# Patient Record
Sex: Female | Born: 1960 | Race: White | Hispanic: No | Marital: Single | Smoking: Former smoker
Health system: Southern US, Community
[De-identification: ages and names within clinical notes are randomized; demographics above are authoritative.]

---

## 1999-10-03 ENCOUNTER — Other Ambulatory Visit: Admission: RE | Admit: 1999-10-03 | Discharge: 1999-10-03 | Payer: Self-pay | Admitting: Obstetrics and Gynecology

## 2001-04-10 ENCOUNTER — Other Ambulatory Visit: Admission: RE | Admit: 2001-04-10 | Discharge: 2001-04-10 | Payer: Self-pay | Admitting: Obstetrics and Gynecology

## 2001-05-16 ENCOUNTER — Encounter: Payer: Self-pay | Admitting: Obstetrics and Gynecology

## 2001-05-16 ENCOUNTER — Ambulatory Visit (HOSPITAL_COMMUNITY): Admission: RE | Admit: 2001-05-16 | Discharge: 2001-05-16 | Payer: Self-pay | Admitting: Obstetrics and Gynecology

## 2001-08-22 ENCOUNTER — Encounter: Payer: Self-pay | Admitting: Obstetrics and Gynecology

## 2001-08-22 ENCOUNTER — Ambulatory Visit (HOSPITAL_COMMUNITY): Admission: RE | Admit: 2001-08-22 | Discharge: 2001-08-22 | Payer: Self-pay | Admitting: Obstetrics and Gynecology

## 2001-10-08 ENCOUNTER — Inpatient Hospital Stay (HOSPITAL_COMMUNITY): Admission: AD | Admit: 2001-10-08 | Discharge: 2001-10-10 | Payer: Self-pay | Admitting: Obstetrics and Gynecology

## 2001-10-17 ENCOUNTER — Encounter: Admission: RE | Admit: 2001-10-17 | Discharge: 2001-11-16 | Payer: Self-pay | Admitting: Obstetrics and Gynecology

## 2001-11-17 ENCOUNTER — Encounter: Admission: RE | Admit: 2001-11-17 | Discharge: 2001-12-17 | Payer: Self-pay | Admitting: Obstetrics and Gynecology

## 2003-11-11 ENCOUNTER — Other Ambulatory Visit: Admission: RE | Admit: 2003-11-11 | Discharge: 2003-11-11 | Payer: Self-pay | Admitting: Obstetrics and Gynecology

## 2006-08-05 ENCOUNTER — Other Ambulatory Visit: Admission: RE | Admit: 2006-08-05 | Discharge: 2006-08-05 | Payer: Self-pay | Admitting: Obstetrics and Gynecology

## 2008-05-28 ENCOUNTER — Other Ambulatory Visit: Admission: RE | Admit: 2008-05-28 | Discharge: 2008-05-28 | Payer: Self-pay | Admitting: Family Medicine

## 2009-07-01 ENCOUNTER — Other Ambulatory Visit: Admission: RE | Admit: 2009-07-01 | Discharge: 2009-07-01 | Payer: Self-pay | Admitting: Family Medicine

## 2010-06-01 ENCOUNTER — Other Ambulatory Visit
Admission: RE | Admit: 2010-06-01 | Discharge: 2010-06-01 | Payer: Self-pay | Source: Home / Self Care | Admitting: Family Medicine

## 2011-05-18 NOTE — Discharge Summary (Signed)
[facility]  Patient:    Shelley Collins, Shelley Collins Visit Number: [id number] MRN: [id number]          Service Type: OBS Location: [id number] Attending Physician:  Oliver Pila Dictated by:   Alvino Chapel, M.D. Admit Date:  10/08/2001 Discharge Date: 10/10/2001                             Discharge Summary  DISCHARGE DIAGNOSES:          1. Term pregnancy at 39 weeks, delivered.                               2. Advanced maternal age.                               3. Status post normal spontaneous vaginal                                  delivery.  DISCHARGE MEDICATIONS:        1. Motrin 600 mg p.o. q.6h.                               2. Percocet 1-2 tablets p.o. q.4h. p.r.n.  FOLLOW-UP:                    The patient is to follow up in six weeks for her routine postpartum exam.  HISTORY OF PRESENT ILLNESS:   Ms. Shelley Collins is a 50 year old, G4, P3, 0-0-3, who was admitted at 39 weeks for induction given a favorable cervix and advanced maternal age.  Pregnancy had been normal except for her advanced maternal age and decline amniocentesis.  Ultrasound was normal except for renal polyp. This resolved by 34 weeks.  There was a history of neural tube defect in the family but the fetus spine was normal on ultrasound.  PRENATAL LABORATORY:          B positive.  Antibody negative.  RPR nonreactive.  Rubella immuned.  Hepatitis B surface antigen negative.  HIV negative.  GC negative.  Chlamydia negative.  GBS negative.  ASC borderline.  PAST OBSTETRICAL HISTORY:     In 1990, she had normal spontaneous vaginal delivery of a 7 pound 15 ounce infant.  In 1992, she had a vaginal delivery of a 7 pound 15 ounce infant.  In 1995, she had a vaginal delivery of a 7 pound 9 ounce infant.  PAST GYNECOLOGY HISTORY:      She had an abnormal Pap smear with a repeat within normal limits and a colposcopy normal.  PAST SURGICAL HISTORY:        None.  PAST  MEDICAL HISTORY:         None.  ALLERGIES:                    None.  PHYSICAL EXAMINATION:  VITAL SIGNS:                  She was afebrile with stable vital signs.  Fetal heart rate was reactive.  Estimated fetal weight was 7 pounds.  CERVIX:  Cervix was 50% effaced, 1+ cm, and a -2 station.  HOSPITAL COURSE:              She had assisted rupture of membrane with clear fluid obtained and was begun on low dose Pitocin.  The patient quickly reached complete dilatation and pushed well with a normal spontaneous vaginal delivery of a vigorous female infant over a small second degree perineal laceration. Apgars were 9 and 9.  Weight was 7 pounds 5 ounces.  Placenta delivered spontaneously with a three vessel cord and her laceration was repaired with 2-0 Vicryl.  Cervix and rectum were intact.  EBL was approximately 300 cc.  She was then admitted for routine postpartum care and did very well.  On postpartum day two, she remained afebrile with stable vital signs.  She was breast-feeding and therefore was felt stable for discharge and will follow up as previously stated. Dictated by:   Alvino Chapel, M.D. Attending Physician:  Oliver Pila DD:  10/10/01 TD:  10/10/01 Job: [phone] [id number]

## 2011-07-12 ENCOUNTER — Other Ambulatory Visit (HOSPITAL_COMMUNITY)
Admission: RE | Admit: 2011-07-12 | Discharge: 2011-07-12 | Disposition: A | Discharge status: Home / Self Care | Payer: PRIVATE HEALTH INSURANCE | Source: Ambulatory Visit | Attending: Family Medicine | Admitting: Family Medicine

## 2011-07-12 DIAGNOSIS — Z1159 Encounter for screening for other viral diseases: Secondary | ICD-10-CM | POA: Insufficient documentation

## 2011-07-12 DIAGNOSIS — Z124 Encounter for screening for malignant neoplasm of cervix: Secondary | ICD-10-CM | POA: Insufficient documentation

## 2014-06-18 ENCOUNTER — Other Ambulatory Visit: Payer: Self-pay | Admitting: Family Medicine

## 2014-06-18 DIAGNOSIS — E010 Iodine-deficiency related diffuse (endemic) goiter: Secondary | ICD-10-CM

## 2014-06-23 ENCOUNTER — Ambulatory Visit
Admission: RE | Admit: 2014-06-23 | Discharge: 2014-06-23 | Disposition: A | Discharge status: Home / Self Care | Payer: PRIVATE HEALTH INSURANCE | Source: Ambulatory Visit | Attending: Family Medicine | Admitting: Family Medicine

## 2014-06-23 DIAGNOSIS — E010 Iodine-deficiency related diffuse (endemic) goiter: Secondary | ICD-10-CM

## 2014-07-15 ENCOUNTER — Encounter: Payer: Self-pay | Admitting: Internal Medicine

## 2014-07-15 ENCOUNTER — Ambulatory Visit (INDEPENDENT_AMBULATORY_CARE_PROVIDER_SITE_OTHER): Payer: PRIVATE HEALTH INSURANCE | Admitting: Internal Medicine

## 2014-07-15 VITALS — BP 120/74 | HR 62 | Temp 98.2°F | Resp 12 | Ht 67.75 in | Wt 152.6 lb

## 2014-07-15 DIAGNOSIS — E042 Nontoxic multinodular goiter: Secondary | ICD-10-CM

## 2014-07-15 LAB — VITAMIN D 25 HYDROXY (VIT D DEFICIENCY, FRACTURES): VITD: 42.63 ng/mL

## 2014-07-15 NOTE — Progress Notes (Addendum)
Patient ID: Shelley Collins, female   DOB: 06-Jan-1961, 53 y.o.   MRN: [id number]   HPI  Shelley Collins is a 53 y.o.-year-old femaler-old female, referred by her PCP, Dr. Percell Miller, in consultation for 1 thyroid nodule and 1 possible parathyroid nodule.  Thyroid U/S (06/23/2014): 1.5 x 0.7 x 0.6 cm mass posterior to the lower L lobe (? Parathyroid tissue) and 1.4 x 0.7 x 0.9 cm nodule in lower R lobe (with microcalcifications).  I reviewed pt's thyroid tests: 06/17/2014: TSH 1.04   Pt denies feeling nodules in neck, hoarseness, + dysphagia with pills within the last 6 mo/no odynophagia, SOB with lying down. She felt pressure in her neck when she lay down.  Pt c/o: - heat intolerance/cold intolerance - tremors - palpitations - anxiety/depression - hyperdefecation/constipation - weight loss - weight gain - dry skin - hair falling - problems with concentration - fatigue  Pt does have a FH of thyroid ds: GM. No FH of thyroid cancer. No h/o radiation tx to head or neck. No h/o hypercalcemia. Last calcium levels reportedly normal last year.  No seaweed or kelp, no recent contrast studies. No steroid use. No herbal supplements.   ROS: Constitutional: + weight loss (more active during the summer - playing tennis), no fatigue, + subjective hypothermia, + poor sleep Eyes: no blurry vision, no xerophthalmia ENT: no sore throat, see HPICardiovascular: no CP/+ SOB/no palpitations/leg swelling Respiratory: no cough/+ SOB Gastrointestinal: no N/V/D/+ C Musculoskeletal: no muscle/joint aches Skin: + rash, + hair loss Neurological: no tremors/numbness/tingling/dizziness Psychiatric: no depression/anxiety  No PMH.  No PSxH.  History   Social History  . Marital Status: married    Spouse Name: N/A    Number of Children: 4   Occupational History  . RN   Social History Main Topics  . Smoking status: Former Smoker, quit 1990  . Smokeless tobacco: No  . Alcohol Use: 2-3x a week, wine 1-2 drinks  . Drug  Use: No   Current Outpatient Rx  Name  Route  Sig  Dispense  Refill  . amphetamine-dextroamphetamine (ADDERALL) 30 MG tablet   Oral   Take 30 mg by mouth daily.         . Fish Oil-Cholecalciferol (FISH OIL + D3 PO)      1 capsule         . Multiple Vitamin (MULTIVITAMIN) tablet   Oral   Take 1 tablet by mouth daily.          NKDA  FH:  father - DM2 mother - HTN, HL  PE: BP 120/74  Pulse 62  Temp(Src) 98.2 F (36.8 C) (Oral)  Resp 12  Ht 5' 7.75" (1.721 m)  Wt 152 lb 9.6 oz (69.219 kg)  BMI 23.37 kg/m2  SpO2 98% Wt Readings from Last 3 Encounters:  07/15/14 152 lb 9.6 oz (69.219 kg)   Constitutional: overweight, in NAD Eyes: PERRLA, EOMI, no exophthalmos ENT: moist mucous membranes, no thyromegaly , no nodules palpated..., no cervical lymphadenopathy Cardiovascular: RRR, No MRG Respiratory: CTA B Gastrointestinal: abdomen soft, NT, ND, BS+ Musculoskeletal: no deformities, strength intact in all 4;  Skin: moist, warm, no rashes Neurological: no tremor with outstretched hands, DTR normal in all 4  ASSESSMENT: 1. Thyroid nodules (possibly 1 parathyroid nodule)  PLAN: 1.  - I reviewed the images of her thyroid ultrasound along with the patient and her husband I pointed out that the dominant nodules are not large, but the right nodule has microcalcifications and internal blood flow. Pt  does not have a thyroid cancer family history or a personal history of RxTx to head/neck, favoring benignity. - the only way that we can tell exactly if it is cancer or not is by doing a thyroid biopsy (FNA) of each. I explained what the test entails. - We discussed about other options, to wait for another 6 months to a year and see if the nodule grows, and only intervene at that time.  - I explained that this is not cancer, we can continue to follow her on a yearly basis, and check another ultrasound in another year or 2. - she should let me know if she develops neck compression  symptoms, in that case, we might need to do either lobectomy or thyroidectomy - I did explain that, while thyroid surgery is not a complicated one, it still can have side effects and also she might have a risk of ~25% of becoming hypothyroid after hemithyroidectomy. She wousl like to avoid surgery, if possible. - patient decided to have the FNA done now >> I will order this, but will first check a PTH + Ca just in case the L nodule might be parathyroid cancer (highly doubt this) in which a Bx is contraindicated. - I'll see her back in a year, assuming her FNA is normal. If FNA abnormal, we will meet sooner.  - I advised pt to join my chart and I will send her the results through there   Office Visit on 07/15/2014  Component Date Value Ref Range Status  . Calcium 07/15/2014 9.7  8.7 - 10.2 mg/dL Final  . PTH 07/15/2014 18  15 - 65 pg/mL Final  . PTH 07/15/2014 Comment   Final   Comment: Interpretation                 Intact PTH    Calcium                                                          (pg/mL)      (mg/dL)                          Normal                          15 - 65     8.6 - 10.2                          Primary Hyperparathyroidism         >65          >10.2                          Secondary Hyperparathyroidism       >65          <10.2                          Non-Parathyroid Hypercalcemia       <65          >10.2  Hypoparathyroidism                  <15          < 8.6                          Non-Parathyroid Hypocalcemia    15 - 65          < 8.6  . VITD 07/15/2014 42.63   Final   Deficient = <20Insufficient = 20 to <30Sufficient = 30 -100Upper Safety Limit = >100   PTH, calcium, and vitamin D are normal >> the L nodule does not appear to over-secrete parathyroid hormone >> I will order the 2 nodule Bx's as discussed.  Adequacy Reason Satisfactory For Evaluation. Diagnosis THYROID, FINE NEEDLE ASPIRATION LEFT (SPECIMEN 1 OF 2 COLLECTED ON  07/28/2014) BENIGN. FINDINGS CONSISTENT WITH A BENIGN FOLLICULAR NODULE. Enid Cutter MD Pathologist, Electronic Signature (Case signed 07/29/2014) Specimen Clinical Information Thyroid enlargement, There is a 1.5 x 0.7 x 0.6cm mass posterior to the lower pole of the left thyroid gland, This represent parathyroid tissue  Adequacy Reason Satisfactory For Evaluation. Diagnosis THYROID, FINE NEEDLE ASPIRATION RIGHT ( SPECIMEN 2 OF 2, COLLECTED ON 07/28/2014) BENIGN. FINDINGS CONSISTENT WITH A BENIGN FOLLICULAR NODULE. Enid Cutter MD Pathologist, Electronic Signature (Case signed 07/29/2014) Specimen Clinical Information Thyroid enlargement, Lower pole solid nodule measures 1.4 x 0.7 x 0.9cm with microcalcifications  Benign thyroid nodules.

## 2014-07-15 NOTE — Patient Instructions (Signed)
Please come back in 1 year. Please let me know if you develop more neck problems. We will let you know about the results and then will schedule the biopsy.  Thyroid Biopsy The thyroid gland is a butterfly-shaped gland situated in the front of the neck. It produces hormones which affect metabolism, growth and development, and body temperature. A thyroid biopsy is a procedure in which small samples of tissue or fluid are removed from the thyroid gland or mass and examined under a microscope. This test is done to determine the cause of thyroid problems, such as infection, cancer, or other thyroid problems. There are 2 ways to obtain samples: 1. Fine needle biopsy. Samples are removed using a thin needle inserted through the skin and into the thyroid gland or mass. 2. Open biopsy. Samples are removed after a cut (incision) is made through the skin. LET YOUR CAREGIVER KNOW ABOUT:   Allergies.  Medications taken including herbs, eye drops, over-the-counter medications, and creams.  Use of steroids (by mouth or creams).  Previous problems with anesthetics or numbing medicine.  Possibility of pregnancy, if this applies.  History of blood clots (thrombophlebitis).  History of bleeding or blood problems.  Previous surgery.  Other health problems. RISKS AND COMPLICATIONS  Bleeding from the site. The risk of bleeding is higher if you have a bleeding disorder or are taking any blood thinning medications (anticoagulants).  Infection.  Injury to structures near the thyroid gland. BEFORE THE PROCEDURE  This is a procedure that can be done as an outpatient. Confirm the time that you need to arrive for your procedure. Confirm whether there is a need to fast or withhold any medications. A blood sample may be done to determine your blood clotting time. Medicine may be given to help you relax (sedative). PROCEDURE Fine needle biopsy. You will be awake during the procedure. You may be asked to lie  on your back with your head tipped backward to extend your neck. Let your caregiver know if you cannot tolerate the positioning. An area on your neck will be cleansed. A needle is inserted through the skin of your neck. You may feel a mild discomfort during this procedure. You may be asked to avoid coughing, talking, swallowing, or making sounds during some portions of the procedure. The needle is withdrawn once tissue or fluid samples have been removed. Pressure may be applied to the neck to reduce swelling and ensure that bleeding has stopped. The samples will be sent for examination.  Open biopsy. You will be given general anesthesia. You will be asleep during the procedure. An incision is made in your neck. A sample of thyroid tissue or the mass is removed. The tissue sample or mass will be sent for examination. The sample or mass may be examined during the biopsy. If the sample or mass contains cancer cells, some or all of the thyroid gland may be removed. The incision is closed with stitches. AFTER THE PROCEDURE  Your recovery will be assessed and monitored. If there are no problems, as an outpatient, you should be able to go home shortly after the procedure. If you had a fine needle biopsy:  You may have soreness at the biopsy site for 1 to 2 days. If you had an open biopsy:   You may have soreness at the biopsy site for 3 to 4 days.  You may have a hoarse voice or sore throat for 1 to 2 days. Obtaining the Test Results It is your responsibility  to obtain your test results. Do not assume everything is normal if you have not heard from your caregiver or the medical facility. It is important for you to follow up on all of your test results. HOME CARE INSTRUCTIONS   Keeping your head raised on a pillow when you are lying down may ease biopsy site discomfort.  Supporting the back of your head and neck with both hands as you sit up from a lying position may ease biopsy site discomfort.  Only  take over-the-counter or prescription medicines for pain, discomfort, or fever as directed by your caregiver.  Throat lozenges or gargling with warm salt water may help to soothe a sore throat. SEEK IMMEDIATE MEDICAL CARE IF:   You have severe bleeding from the biopsy site.  You have difficulty swallowing.  You have a fever.  You have increased pain, swelling, redness, or warmth at the biopsy site.  You notice pus coming from the biopsy site.  You have swollen glands (lymph nodes) in your neck. Document Released: 10/14/2007 Document Revised: 04/13/2013 Document Reviewed: 03/16/2009 [facility] Patient Information 2015 [place], [place]. This information is not intended to replace advice given to you by your health care provider. Make sure you discuss any questions you have with your health care provider.

## 2014-07-16 ENCOUNTER — Other Ambulatory Visit: Payer: Self-pay | Admitting: Internal Medicine

## 2014-07-16 DIAGNOSIS — E042 Nontoxic multinodular goiter: Secondary | ICD-10-CM

## 2014-07-16 LAB — PTH, INTACT AND CALCIUM
Calcium: 9.7 mg/dL (ref 8.7–10.2)
PTH: 18 pg/mL (ref 15–65)

## 2014-07-28 ENCOUNTER — Ambulatory Visit
Admission: RE | Admit: 2014-07-28 | Discharge: 2014-07-28 | Disposition: A | Discharge status: Home / Self Care | Payer: PRIVATE HEALTH INSURANCE | Source: Ambulatory Visit | Attending: Internal Medicine | Admitting: Internal Medicine

## 2014-07-28 ENCOUNTER — Other Ambulatory Visit (HOSPITAL_COMMUNITY)
Admission: RE | Admit: 2014-07-28 | Discharge: 2014-07-28 | Disposition: A | Discharge status: Home / Self Care | Payer: 59 | Source: Ambulatory Visit | Attending: Interventional Radiology | Admitting: Interventional Radiology

## 2014-07-28 DIAGNOSIS — E042 Nontoxic multinodular goiter: Secondary | ICD-10-CM | POA: Insufficient documentation

## 2014-07-28 DIAGNOSIS — E049 Nontoxic goiter, unspecified: Secondary | ICD-10-CM | POA: Diagnosis present

## 2015-07-18 ENCOUNTER — Ambulatory Visit: Payer: PRIVATE HEALTH INSURANCE | Admitting: Internal Medicine

## 2015-09-01 ENCOUNTER — Ambulatory Visit (INDEPENDENT_AMBULATORY_CARE_PROVIDER_SITE_OTHER): Payer: 59 | Admitting: Internal Medicine

## 2015-09-01 VITALS — BP 122/68 | HR 64 | Temp 97.9°F | Resp 12 | Wt 162.6 lb

## 2015-09-01 DIAGNOSIS — E042 Nontoxic multinodular goiter: Secondary | ICD-10-CM | POA: Diagnosis not present

## 2015-09-01 LAB — TSH: TSH: 1.44 u[IU]/mL (ref 0.35–4.50)

## 2015-09-01 LAB — VITAMIN D 25 HYDROXY (VIT D DEFICIENCY, FRACTURES): VITD: 37.41 ng/mL (ref 30.00–100.00)

## 2015-09-01 NOTE — Progress Notes (Signed)
Patient ID: Shelley Collins, female   DOB: Jun 25, 1961, 54 y.o.   MRN: [id number]   HPI  Shelley Collins is a 54 y.o.-year-old female, initially referred by her PCP, Dr. Percell Miller, returning for follow-up for  Thyroid nodules.  Patient's thyroid nodules were found on an ultrasound in 2015:  Thyroid U/S (06/23/2014): 1.5 x 0.7 x 0.6 cm mass posterior to the lower L lobe (? Parathyroid tissue) and 1.4 x 0.7 x 0.9 cm nodule in lower R lobe (with microcalcifications).  As one of the nodules was considered to possibly be parathyroid tissue,  We checked the PTH level , which returned normal: Component     Latest Ref Rng 07/15/2014 07/15/2014         9:29 AM  9:29 AM  Calcium     8.7 - 10.2 mg/dL  9.7  PTH     15 - 65 pg/mL  18  VITD      42.63    I reviewed pt's thyroid tests: 06/17/2014: TSH 1.04   We biopsied the 2 nodules in 2015, and the biopsies were benign. There was confusion in the reading of the left thyroid nodule, which was read as  Benign follicular tissue , however in the  Pathology comment,  It was mentioned that this is parathyroid tissue...  Pt denies feeling nodules in neck, hoarseness,  no dysphagia/no odynophagia, SOB with lying down.   She has leg cramps, constipation off and on.   Pt denies: - heat intolerance/cold intolerance - tremors - palpitations - anxiety/depression - hyperdefecation/constipation - weight loss - weight gain - dry skin - hair falling - problems with concentration - fatigue  Pt does have a FH of thyroid ds: GM. No FH of thyroid cancer. No h/o radiation tx to head or neck. No h/o hypercalcemia.  She denies using seaweed or kelp, no recent contrast studies. No steroid use. No herbal supplements.  She does not take biotin or any B vitamins.  ROS: Constitutional: + weight loss (more active during the summer - playing tennis), no fatigue, + subjective hypothermia, + poor sleep Eyes: no blurry vision, no xerophthalmia ENT: no sore  throat, see HPICardiovascular: no CP/+ SOB/no palpitations/leg swelling Respiratory: no cough/+ SOB Gastrointestinal: no N/V/D/+ C Musculoskeletal: no muscle/joint aches Skin: no rash, no  hair loss Neurological: no tremors/numbness/tingling/dizziness  No PMH.  No PSxH.  History   Social History  . Marital Status: married    Spouse Name: N/A    Number of Children: 4   Occupational History  . RN   Social History Main Topics  . Smoking status: Former Smoker, quit 1990  . Smokeless tobacco: No  . Alcohol Use: 2-3x a week, wine 1-2 drinks  . Drug Use: No   Current Outpatient Rx  Name  Route  Sig  Dispense  Refill  . amphetamine-dextroamphetamine (ADDERALL) 30 MG tablet   Oral   Take 30 mg by mouth daily.         . Fish Oil-Cholecalciferol (FISH OIL + D3 PO)      1 capsule         . Multiple Vitamin (MULTIVITAMIN) tablet   Oral   Take 1 tablet by mouth daily.          NKDA  FH:  father - DM2 mother - HTN, HL  PE: BP 122/68 mmHg  Pulse 64  Temp(Src) 97.9 F (36.6 C) (Oral)  Resp 12  Wt 162 lb 9.6 oz (73.755 kg)  SpO2 98%  Body mass index is 24.9 kg/(m^2). Wt Readings from Last 3 Encounters:  09/01/15 162 lb 9.6 oz (73.755 kg)  07/15/14 152 lb 9.6 oz (69.219 kg)   Constitutional:  Normal weight, in NAD Eyes: PERRLA, EOMI, no exophthalmos ENT: moist mucous membranes, no thyromegaly , no nodules palpated, no cervical lymphadenopathy Cardiovascular: RRR, No MRG Respiratory: CTA B Gastrointestinal: abdomen soft, NT, ND, BS+ Musculoskeletal: no deformities, strength intact in all 4;  Skin: moist, warm, no rashes Neurological: no tremor with outstretched hands, DTR normal in all 4  ASSESSMENT: 1. Thyroid nodules: - Thyroid U/S (06/23/2014): 1.5 x 0.7 x 0.6 cm mass posterior to the lower L lobe (? Parathyroid tissue) and 1.4 x 0.7 x 0.9 cm nodule in lower R lobe (with microcalcifications). - FNA (07/28/2014) both nodules = benign:  Adequacy  Reason Satisfactory For Evaluation. Diagnosis THYROID, FINE NEEDLE ASPIRATION LEFT (SPECIMEN 1 OF 2 COLLECTED ON 07/28/2014) BENIGN. FINDINGS CONSISTENT WITH A BENIGN FOLLICULAR NODULE. Enid Cutter MD Pathologist, Electronic Signature (Case signed 07/29/2014) Specimen Clinical Information Thyroid enlargement, There is a 1.5 x 0.7 x 0.6cm mass posterior to the lower pole of the left thyroid gland, This represent parathyroid tissue  Adequacy Reason Satisfactory For Evaluation. Diagnosis THYROID, FINE NEEDLE ASPIRATION RIGHT ( SPECIMEN 2 OF 2, COLLECTED ON 07/28/2014) BENIGN. FINDINGS CONSISTENT WITH A BENIGN FOLLICULAR NODULE. Enid Cutter MD Pathologist, Electronic Signature (Case signed 07/29/2014) Specimen Clinical Information Thyroid enlargement, Lower pole solid nodule measures 1.4 x 0.7 x 0.9cm with microcalcifications  PLAN: 1.  - I reviewed the cytology reports for her thyroid nodules along with the patient. They have been read as benign follicular tissue, however, we discussed that one of them appears to be parathyroid tissue.  I called to discuss with the pathologist about the conflicting results  - they will call me back. -  The  Left nodule is not producing excessive PTH, as per the last check in 06/2014. We will repeat a PTH, calcium, vitamin D today. I will also repeat a TSH. -  We also discussed about the plan to keep an eye on the thyroid nodules, but no intervention is needed -  I will order a thyroid ultrasound for now , and will then see the patient back in 2 years to follow-up on her nodules -  Patient will let me know if he develops neck compression symptoms.  She does not have any hoarseness, dysphagia, or recumbent shortness of breath now - Return in about 2 years (around 08/31/2017).  Orders Placed This Encounter  Procedures  . US Soft Tissue Head/Neck  . Vitamin D (25 hydroxy)  . PTH, Intact and Calcium  . TSH    I called and discussed with Dr. Avis Epley in  pathology and he reviewed the slides again >>  Left nodule is thyroid in origin and not parathyroid. I will let the patient know.  Office Visit on 09/01/2015  Component Date Value Ref Range Status  . VITD 09/01/2015 37.41  30.00 - 100.00 ng/mL Final  . Calcium 09/01/2015 9.8  8.7 - 10.2 mg/dL Final  . PTH 09/01/2015 20  15 - 65 pg/mL Final  . PTH 09/01/2015 Comment   Final   Comment: Interpretation                 Intact PTH    Calcium                                 (  pg/mL)      (mg/dL) Normal                          15 - 65     8.6 - 10.2 Primary Hyperparathyroidism         >65          >10.2 Secondary Hyperparathyroidism       >65          <10.2 Non-Parathyroid Hypercalcemia       <65          >10.2 Hypoparathyroidism                  <15          < 8.6 Non-Parathyroid Hypocalcemia    15 - 65          < 8.6   . TSH 09/01/2015 1.44  0.35 - 4.50 uIU/mL Final   All labs normal.  CLINICAL DATA: Followup thyroid nodules, history of thyroid biopsy  EXAM: THYROID ULTRASOUND  TECHNIQUE: Ultrasound examination of the thyroid gland and adjacent soft tissues was performed.  COMPARISON: 06/23/2014  FINDINGS: Right thyroid lobe  Measurements: 5.5 x 0.9 x 1.6 cm. Multiple small hypoechoic nodules are noted throughout the right lobe of the thyroid. A dominant mildly hypoechoic nodule is again seen within the right lobe of the thyroid. It measures 12 mm in greatest dimension and has previously been biopsied.  Left thyroid lobe  Measurements: 5.2 x 0.8 x 1.4 cm. Diffuse small hypoechoic nodules are noted in the upper pole. A 15 mm somewhat exophytic lesion is again identified and stable in appearance. No new focal nodules are seen.  Isthmus  Thickness: 2 mm. No nodules visualized.  Lymphadenopathy  None visualized.  IMPRESSION: Bilateral thyroid nodules which are stable in appearance and have been previously biopsied. No new focal abnormality is  noted.   Electronically Signed By: Inez Catalina M.D. On: 09/08/2015 11:07

## 2015-09-01 NOTE — Patient Instructions (Signed)
We will schedule a thyroid U/S in [facility].  Please stop at the lab.  Please return in 2 years.

## 2015-09-02 LAB — PTH, INTACT AND CALCIUM
Calcium: 9.8 mg/dL (ref 8.7–10.2)
PTH: 20 pg/mL (ref 15–65)

## 2015-09-08 ENCOUNTER — Encounter: Payer: Self-pay | Admitting: Internal Medicine

## 2015-09-08 ENCOUNTER — Ambulatory Visit
Admission: RE | Admit: 2015-09-08 | Discharge: 2015-09-08 | Disposition: A | Discharge status: Home / Self Care | Payer: 59 | Source: Ambulatory Visit | Attending: Internal Medicine | Admitting: Internal Medicine

## 2016-02-23 ENCOUNTER — Other Ambulatory Visit: Payer: Self-pay | Admitting: Family Medicine

## 2016-02-23 ENCOUNTER — Other Ambulatory Visit (HOSPITAL_COMMUNITY)
Admission: RE | Admit: 2016-02-23 | Discharge: 2016-02-23 | Disposition: A | Discharge status: Home / Self Care | Payer: 59 | Source: Ambulatory Visit | Attending: Family Medicine | Admitting: Family Medicine

## 2016-02-23 DIAGNOSIS — F900 Attention-deficit hyperactivity disorder, predominantly inattentive type: Secondary | ICD-10-CM | POA: Diagnosis not present

## 2016-02-23 DIAGNOSIS — Z0000 Encounter for general adult medical examination without abnormal findings: Secondary | ICD-10-CM | POA: Diagnosis not present

## 2016-02-23 DIAGNOSIS — Z1151 Encounter for screening for human papillomavirus (HPV): Secondary | ICD-10-CM | POA: Diagnosis not present

## 2016-02-23 DIAGNOSIS — Z01411 Encounter for gynecological examination (general) (routine) with abnormal findings: Secondary | ICD-10-CM | POA: Diagnosis not present

## 2016-02-23 DIAGNOSIS — Z79899 Other long term (current) drug therapy: Secondary | ICD-10-CM | POA: Diagnosis not present

## 2016-02-23 DIAGNOSIS — Z13220 Encounter for screening for lipoid disorders: Secondary | ICD-10-CM | POA: Diagnosis not present

## 2016-02-23 DIAGNOSIS — E559 Vitamin D deficiency, unspecified: Secondary | ICD-10-CM | POA: Diagnosis not present

## 2016-02-23 DIAGNOSIS — N952 Postmenopausal atrophic vaginitis: Secondary | ICD-10-CM | POA: Diagnosis not present

## 2016-02-23 DIAGNOSIS — Z1211 Encounter for screening for malignant neoplasm of colon: Secondary | ICD-10-CM | POA: Diagnosis not present

## 2016-02-23 DIAGNOSIS — Z780 Asymptomatic menopausal state: Secondary | ICD-10-CM | POA: Diagnosis not present

## 2016-02-27 LAB — CYTOLOGY - PAP

## 2016-05-23 DIAGNOSIS — F900 Attention-deficit hyperactivity disorder, predominantly inattentive type: Secondary | ICD-10-CM | POA: Diagnosis not present

## 2016-08-22 DIAGNOSIS — F900 Attention-deficit hyperactivity disorder, predominantly inattentive type: Secondary | ICD-10-CM | POA: Diagnosis not present

## 2016-08-22 DIAGNOSIS — M722 Plantar fascial fibromatosis: Secondary | ICD-10-CM | POA: Diagnosis not present

## 2017-11-26 ENCOUNTER — Encounter: Payer: Self-pay | Admitting: Internal Medicine

## 2017-11-26 ENCOUNTER — Ambulatory Visit (INDEPENDENT_AMBULATORY_CARE_PROVIDER_SITE_OTHER): Payer: 59 | Admitting: Internal Medicine

## 2017-11-26 VITALS — BP 130/90 | HR 82 | Wt 176.0 lb

## 2017-11-26 DIAGNOSIS — E042 Nontoxic multinodular goiter: Secondary | ICD-10-CM

## 2017-11-26 DIAGNOSIS — R635 Abnormal weight gain: Secondary | ICD-10-CM | POA: Diagnosis not present

## 2017-11-26 NOTE — Patient Instructions (Signed)
We will call you to schedule the new thyroid U/S.  Please ask your PCP to check another U/S in 5 years.   Please return to see me as needed.

## 2017-11-26 NOTE — Progress Notes (Addendum)
Patient ID: Shelley Collins, female   DOB: 11-05-1961, 56 y.o.   MRN: [id number]   HPI  Shelley Collins is a 56 y.o.-year-old female, initially referred by her PCP, Dr. Percell Miller,  returning for follow-up for Thyroid nodules.  Last visit 2 years ago.  Reviewed and addended history: Patient's thyroid nodules were found on an ultrasound in 2015:  Thyroid U/S (06/23/2014): 1.5 x 0.7 x 0.6 cm mass posterior to the lower L lobe (? Parathyroid tissue) and 1.4 x 0.7 x 0.9 cm nodule in lower R lobe (with microcalcifications).  We biopsied the 2 nodules in 2015, and the biopsies were benign. There was confusion in the reading of the left thyroid nodule, which was read as  Benign follicular tissue , however in the  Pathology comment,  It was mentioned that this is parathyroid tissue... At that time, I called and discussed with Dr. Avis Epley in pathology and he reviewed the slides again >> left nodule was thyroid in origin and not parathyroid.  As one of the nodules was considered to possibly be parathyroid tissue,  We checked the PTH level , which returns normal: Component     Latest Ref Rng 07/15/2014 07/15/2014         9:29 AM  9:29 AM  Calcium     8.7 - 10.2 mg/dL  9.7  PTH     15 - 65 pg/mL  18  VITD      42.63     Thyroid ultrasound (09/08/2015): Bilateral thyroid nodules which are stable in appearance and have been previously biopsied. No new focal abnormality is noted.  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 No FH of thyroid cancer. No h/o radiation tx to head or neck.  No seaweed or kelp. No recent contrast studies. No herbal supplements. No Biotin  use. No recent steroids use.   ROS: Constitutional: + weight gain/no weight loss, + fatigue, no subjective hyperthermia, no subjective hypothermia Eyes: no blurry vision, no xerophthalmia ENT: no sore throat, + see HPI Cardiovascular: no CP/no SOB/no palpitations/+ leg swelling Respiratory: no cough/no SOB/no wheezing Gastrointestinal: no N/no V/no D/+ C/no acid reflux Musculoskeletal: no muscle aches/no joint aches Skin: no rashes, + hair thinning Neurological: no tremors/no numbness/no tingling/no dizziness  I reviewed pt's medications, allergies, PMH, social hx, family hx, and changes were documented in the history of present illness. Otherwise, unchanged from my initial visit note.  No PMH.  No PSxH.  History   Social History  . Marital Status: married    Spouse Name: N/A    Number of Children: 4   Occupational History  . RN   Social History Main Topics  . Smoking status: Former Smoker, quit 1990  . Smokeless tobacco: No  . Alcohol Use: 2-3x a week, wine 1-2 drinks  . Drug Use: No   Current Outpatient Medications  Medication Sig Dispense Refill  . amphetamine-dextroamphetamine (ADDERALL) 30 MG tablet Take 30 mg by mouth daily.    . Fish Oil-Cholecalciferol (FISH OIL + D3 PO) 1 capsule    . lisdexamfetamine (VYVANSE) 40 MG capsule Take 40 mg  by mouth every morning.    . Multiple Vitamin (MULTIVITAMIN) tablet Take 1 tablet by mouth daily.     No current facility-administered medications for this visit.    NKDA  FH:  father - DM2 mother - HTN, HL  PE: BP 130/90   Pulse 82   Wt 176 lb (79.8 kg)   SpO2 98%   BMI 26.96 kg/m  Body mass index is 26.96 kg/m. Wt Readings from Last 3 Encounters:  11/26/17 176 lb (79.8 kg)  09/01/15 162 lb 9.6 oz (73.8 kg)  07/15/14 152 lb 9.6 oz (69.2 kg)   Constitutional: overweight, in NAD Eyes: PERRLA, EOMI, no exophthalmos ENT: moist mucous membranes, no thyromegaly, no cervical lymphadenopathy Cardiovascular: RRR, No  MRG Respiratory: CTA B Gastrointestinal: abdomen soft, NT, ND, BS+ Musculoskeletal: no deformities, strength intact in all 4 Skin: moist, warm, no rashes Neurological: no tremor with outstretched hands, DTR normal in all 4  ASSESSMENT: 1. Thyroid nodules:  - Thyroid U/S (06/23/2014): 1.5 x 0.7 x 0.6 cm mass posterior to the lower L lobe (? Parathyroid tissue) and 1.4 x 0.7 x 0.9 cm nodule in lower R lobe (with microcalcifications).  - FNA (07/28/2014) both nodules = benign:  Adequacy Reason Satisfactory For Evaluation. Diagnosis THYROID, FINE NEEDLE ASPIRATION LEFT (SPECIMEN 1 OF 2 COLLECTED ON 07/28/2014) BENIGN. FINDINGS CONSISTENT WITH A BENIGN FOLLICULAR NODULE. Enid Cutter MD Pathologist, Electronic Signature (Case signed 07/29/2014) Specimen Clinical Information Thyroid enlargement, There is a 1.5 x 0.7 x 0.6cm mass posterior to the lower pole of the left thyroid gland, This represent parathyroid tissue  Adequacy Reason Satisfactory For Evaluation. Diagnosis THYROID, FINE NEEDLE ASPIRATION RIGHT ( SPECIMEN 2 OF 2, COLLECTED ON 07/28/2014) BENIGN. FINDINGS CONSISTENT WITH A BENIGN FOLLICULAR NODULE. Enid Cutter MD Pathologist, Electronic Signature (Case signed 07/29/2014) Specimen Clinical Information Thyroid enlargement, Lower pole solid nodule measures 1.4 x 0.7 x 0.9cm with microcalcifications  Thyroid ultrasound (09/08/2015): Bilateral thyroid nodules which are stable in appearance and have been previously biopsied. No new focal abnormality is noted.  2.  Weight gain  PLAN: 1.  Thyroid nodules - Patient with a history of stable, benign, thyroid nodules, returning for a 2-year follow-up.  She has been diagnosed with thyroid nodules in 2015 when she had 2 dominant supra centimeter nodules in each of the thyroid lobes.  These were biopsied and the results were benign.  We repeated thyroid ultrasound at last visit, in 2016 and the nodules appear stable, without any  additional findings.  We reviewed the report of this ultrasound together with the patient, since this was obtained after our last visit. - No neck compression symptoms - We also reviewed her previous TSH (checked by PCP in 08/2017) and  this was normal - We discussed about obtaining another ultrasound at this visit and, if nodules are stable, in 5 years from now.  This can be checked by her PCP and forwarded to me if the results are abnormal.  Alternatively, if she develops neck compression symptoms, she can get in contact with me to schedule another appointment. - RTC as needed  2.  Weight gain - Patient gained 14 pounds in the last 1 year and 3 months, and 10 pounds in the year prior.  I agree that this could be related to her sedentary job and the stress at work, but also menopause.  I reassured her that her TFTs were normal and not indicating any hypothyroidism which could be associated with weight gain.  We discussed about  changing her diet and I made specific suggestions.  Orders Placed This Encounter  Procedures  . US THYROID   CLINICAL DATA:  Goiter. 56 year old female with a history of thyroid nodules. She underwent biopsy of bilateral thyroid nodules on 07/28/2014.  EXAM: THYROID ULTRASOUND  TECHNIQUE: Ultrasound examination of the thyroid gland and adjacent soft tissues was performed.  COMPARISON:  Most recent prior thyroid ultrasound 09/08/2015 and 06/23/2014  FINDINGS: Parenchymal Echotexture: Moderately heterogenous  Isthmus: 0.2 cm  Right lobe: 5.3 x 1.0 x 1.5 cm  Left lobe: 5.3 x 0.8 x 1.5 cm  _________________________________________________________  Estimated total number of nodules >/= 1 cm: 2  Number of spongiform nodules >/=  2 cm not described below (TR1): 0  Number of mixed cystic and solid nodules >/= 1.5 cm not described below (TR2): 0  _________________________________________________________  The previously biopsied in the right  inferior gland measures 1.4 x 1.0 x 0.7 cm, insignificantly changed compared to 1.4 x 0.7 x 0.9 cm in June of 2015. The previously biopsied hypoechoic nodule exophytic from the lower pole of the left thyroid gland also remains unchanged at 1.4 x 0.8 x 0.7 cm compared to 1.5 x 0.9 x 0.6 cm previously. Additional tiny bilateral subcentimeter thyroid nodules are also unchanged and do not meet criteria for further evaluation.  IMPRESSION: Continued stability of bilateral previously biopsied thyroid nodules. Recommend correlation with prior biopsy results. Presuming benign results, no further follow-up is required.  The above is in keeping with the ACR TI-RADS recommendations - J Am Coll Radiol 2017;14:587-595.   Electronically Signed   By: Jacqulynn Cadet M.D.   On: 12/16/2017 16:13  Stable thyroid nodules.  Philemon Kingdom, MD PhD [facility] Endocrinology

## 2017-12-16 ENCOUNTER — Ambulatory Visit
Admission: RE | Admit: 2017-12-16 | Discharge: 2017-12-16 | Disposition: A | Discharge status: Home / Self Care | Payer: 59 | Source: Ambulatory Visit | Attending: Internal Medicine | Admitting: Internal Medicine

## 2018-05-09 IMAGING — US US THYROID
1 series · 13 of 25 positions shown · non-contrast
Comparison: Most recent prior thyroid ultrasound 09/08/2015 and
06/23/2014

CLINICAL DATA: Goiter. 55-year-old female with a history of thyroid
nodules. She underwent biopsy of bilateral thyroid nodules on
07/28/2014.

EXAM:
THYROID ULTRASOUND
TECHNIQUE: Ultrasound examination of the thyroid gland and adjacent soft
tissues was performed.

[Series 1: us thyroid · 0.05mm/px · 13 of 53 slices shown]
[im 1/53]
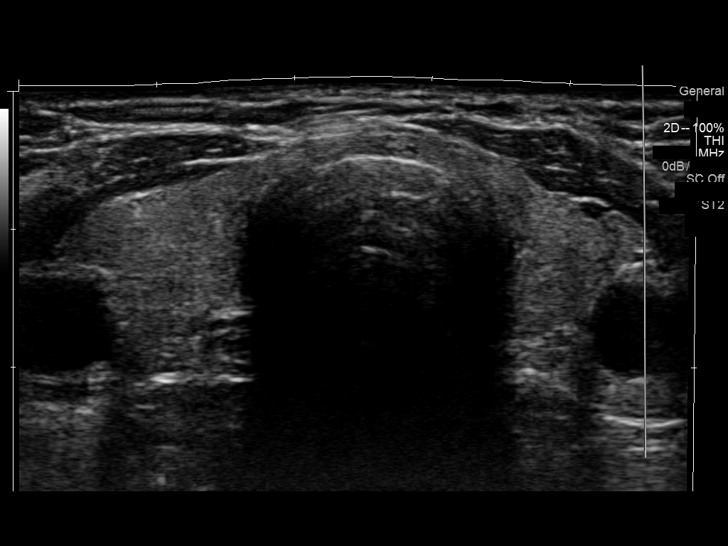
[im 5/53]
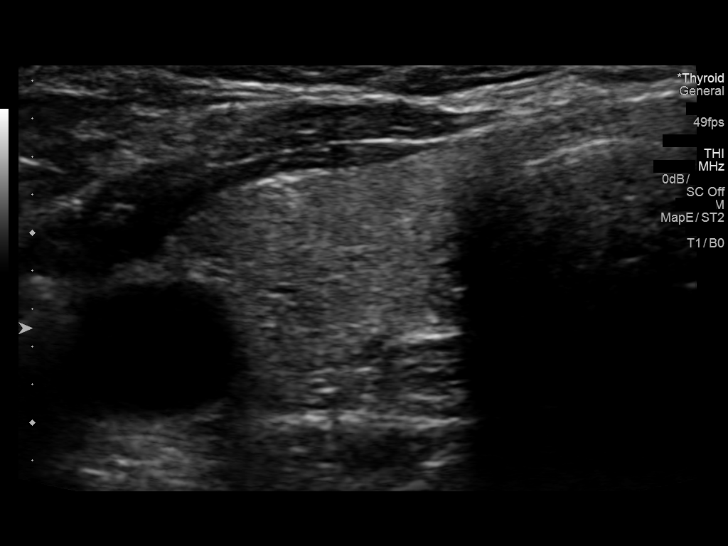
[im 9/53]
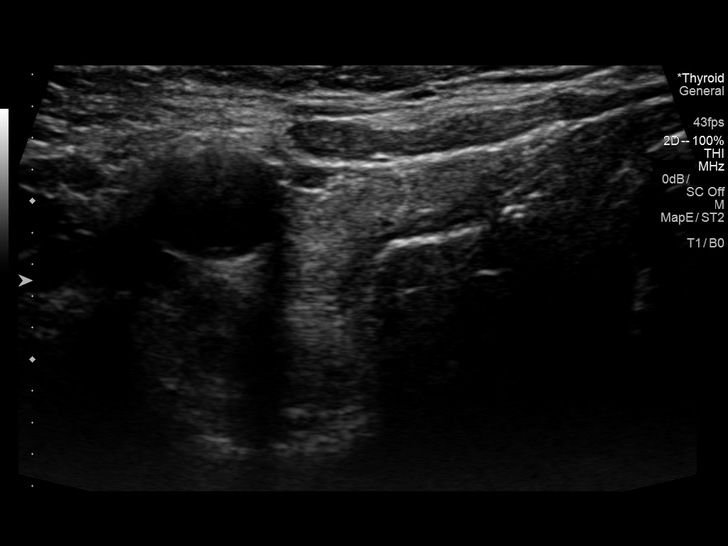
[im 14/53]
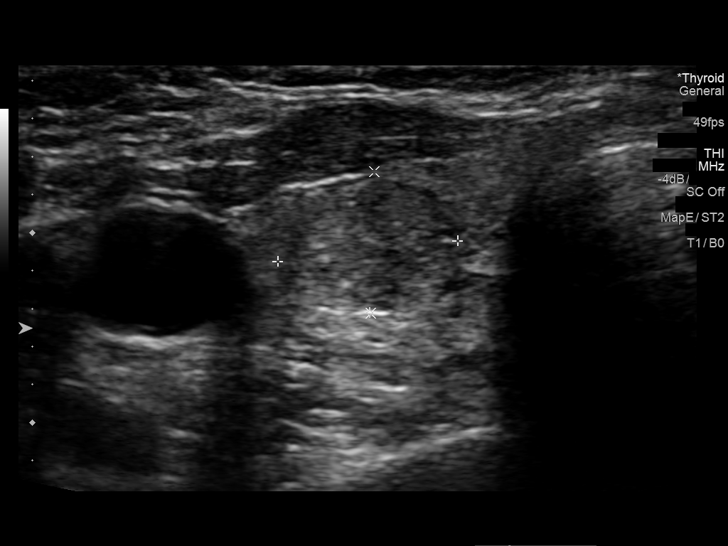
[im 18/53]
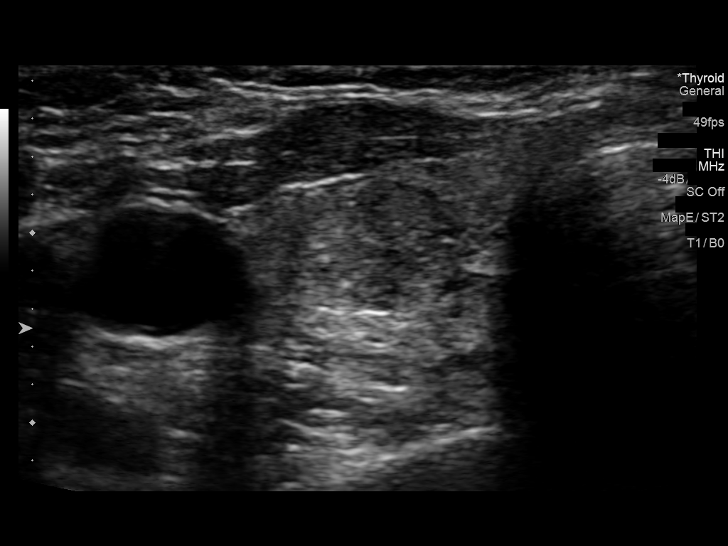
[im 22/53]
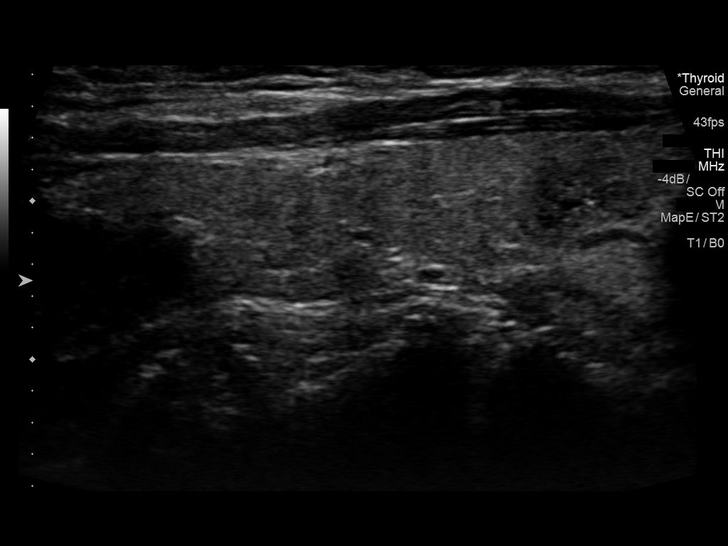
[im 27/53]
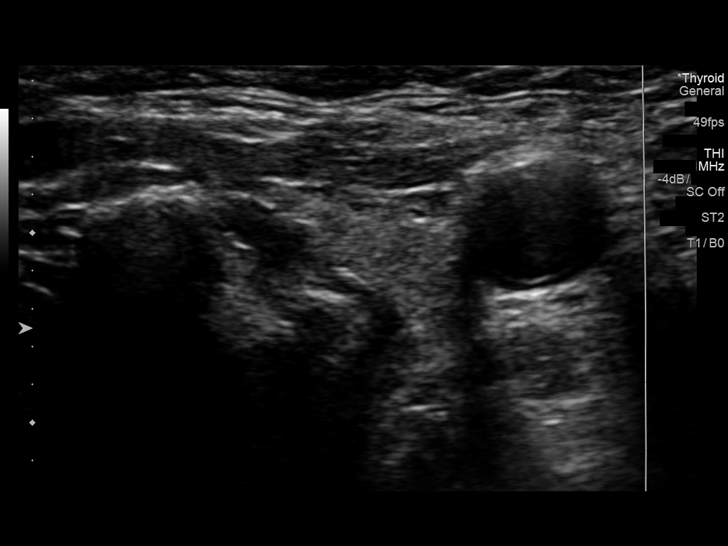
[im 31/53]
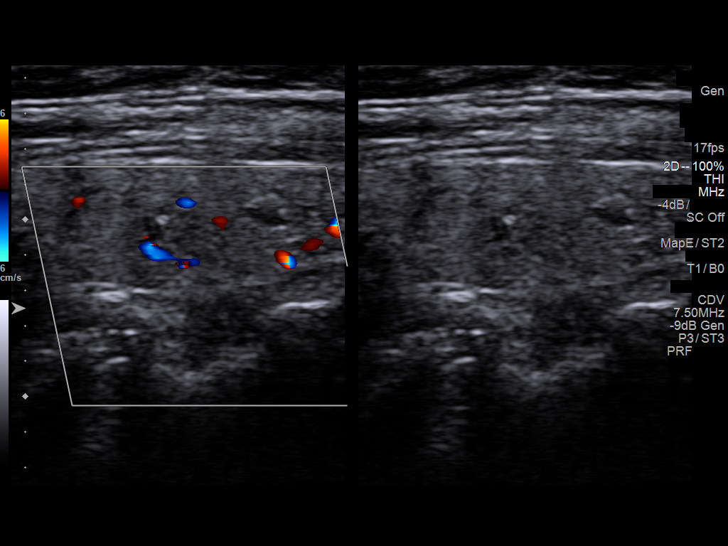
[im 35/53]
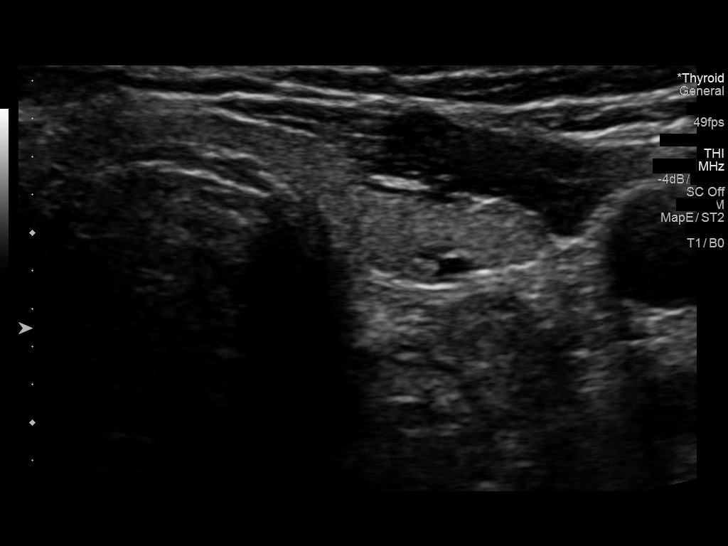
[im 40/53]
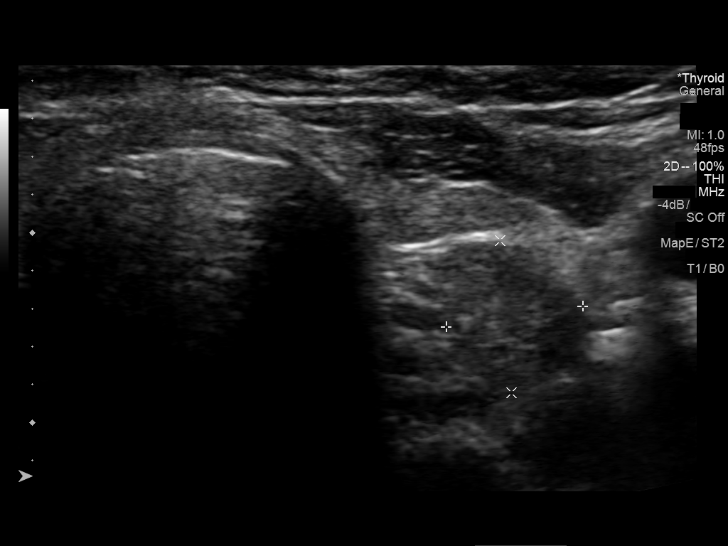
[im 44/53]
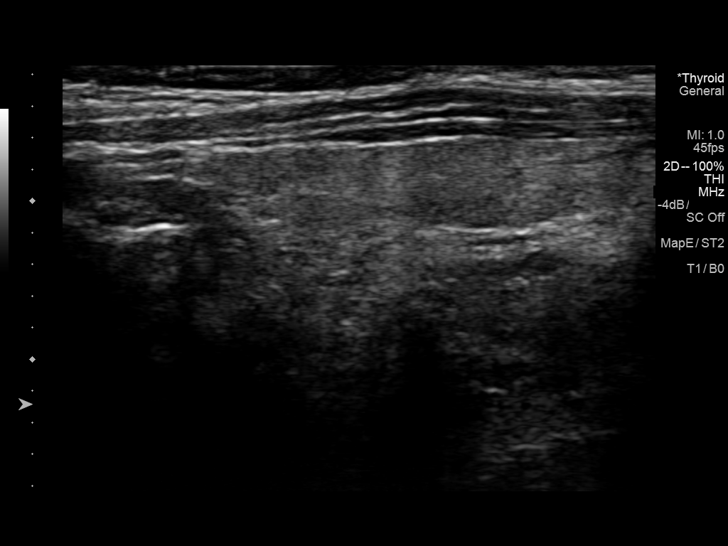
[im 48/53]
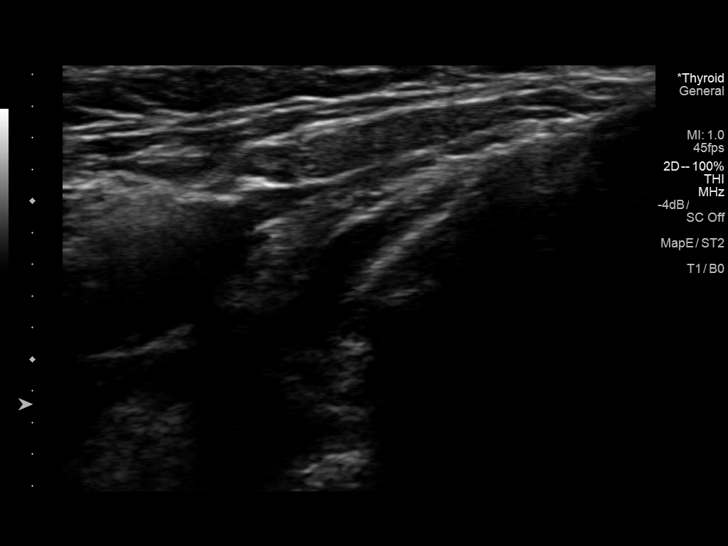
[im 53/53]
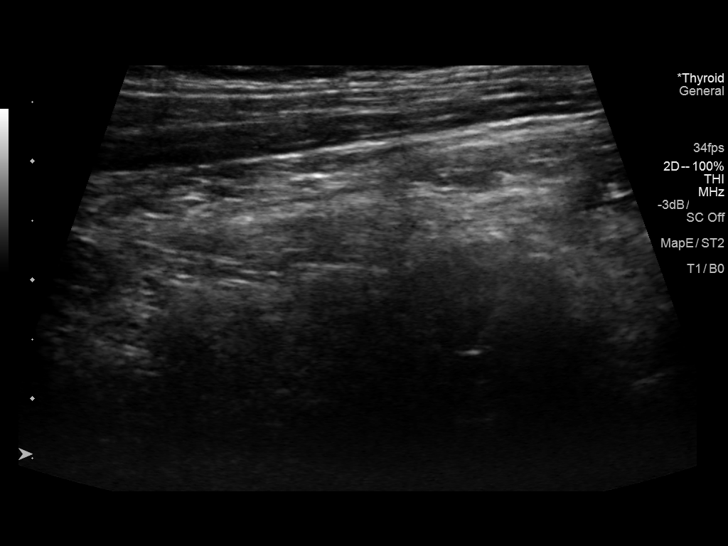

[13 of 25 positions shown; findings below may reference images not displayed]

FINDINGS: Parenchymal Echotexture: Moderately heterogenous

Isthmus: 0.2 cm

Right lobe: 5.3 x 1.0 x 1.5 cm

Left lobe: 5.3 x 0.8 x 1.5 cm

_________________________________________________________

Estimated total number of nodules >/= 1 cm: 2

Number of spongiform nodules >/=  2 cm not described below (TR1): 0

Number of mixed cystic and solid nodules >/= 1.5 cm not described
below (TR2): 0

_________________________________________________________

The previously biopsied in the right inferior gland measures 1.4 x
1.0 x 0.7 cm, insignificantly changed compared to 1.4 x 0.7 x 0.9 cm
in Saturday May, 2014. The previously biopsied hypoechoic nodule exophytic
from the lower pole of the left thyroid gland also remains unchanged
at 1.4 x 0.8 x 0.7 cm compared to 1.5 x 0.9 x 0.6 cm previously.
Additional tiny bilateral subcentimeter thyroid nodules are also
unchanged and do not meet criteria for further evaluation.
IMPRESSION: Continued stability of bilateral previously biopsied thyroid
nodules. Recommend correlation with prior biopsy results. Presuming
benign results, no further follow-up is required.

The above is in keeping with the ACR TI-RADS recommendations - [HOSPITAL] 4827;[DATE].

## 2018-09-30 ENCOUNTER — Other Ambulatory Visit: Payer: Self-pay | Admitting: Obstetrics and Gynecology

## 2018-09-30 DIAGNOSIS — R928 Other abnormal and inconclusive findings on diagnostic imaging of breast: Secondary | ICD-10-CM

## 2018-10-10 ENCOUNTER — Ambulatory Visit
Admission: RE | Admit: 2018-10-10 | Discharge: 2018-10-10 | Disposition: A | Discharge status: Home / Self Care | Payer: 59 | Source: Ambulatory Visit | Attending: Obstetrics and Gynecology | Admitting: Obstetrics and Gynecology

## 2018-10-10 DIAGNOSIS — R928 Other abnormal and inconclusive findings on diagnostic imaging of breast: Secondary | ICD-10-CM

## 2019-03-11 ENCOUNTER — Other Ambulatory Visit: Payer: Self-pay | Admitting: Physician Assistant

## 2019-05-11 IMAGING — MG DIGITAL DIAGNOSTIC BILATERAL MAMMOGRAM WITH TOMO AND CAD
8 series · 8 of 24 positions shown · non-contrast
Comparison: Previous exam(s).

CLINICAL DATA: Bilateral screening recall for possible masses.

EXAM:
DIGITAL DIAGNOSTIC BILATERAL MAMMOGRAM WITH CAD AND TOMO
ULTRASOUND BILATERAL BREAST

[L CC synth-2D]
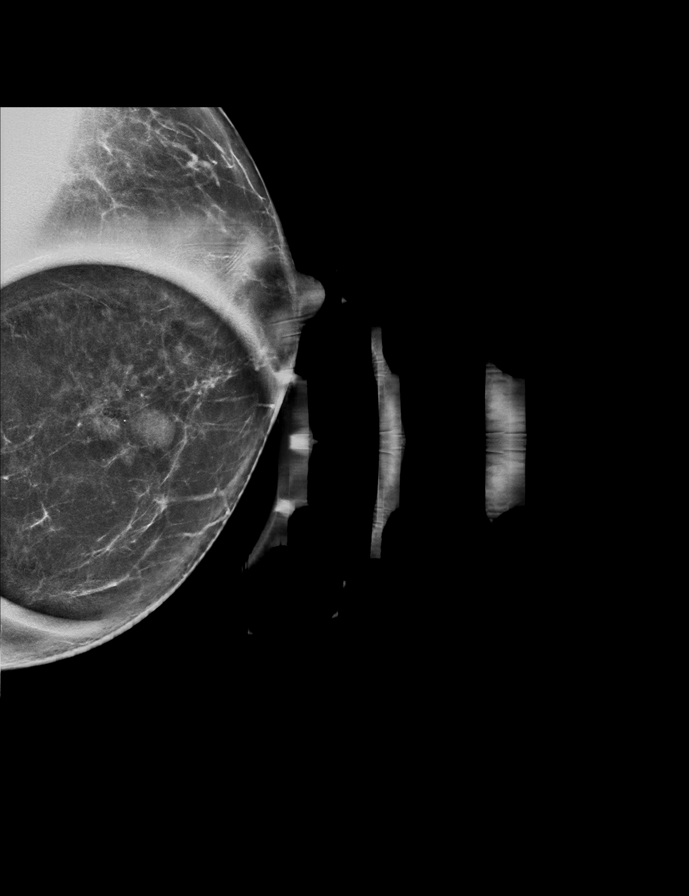

[R CC synth-2D]
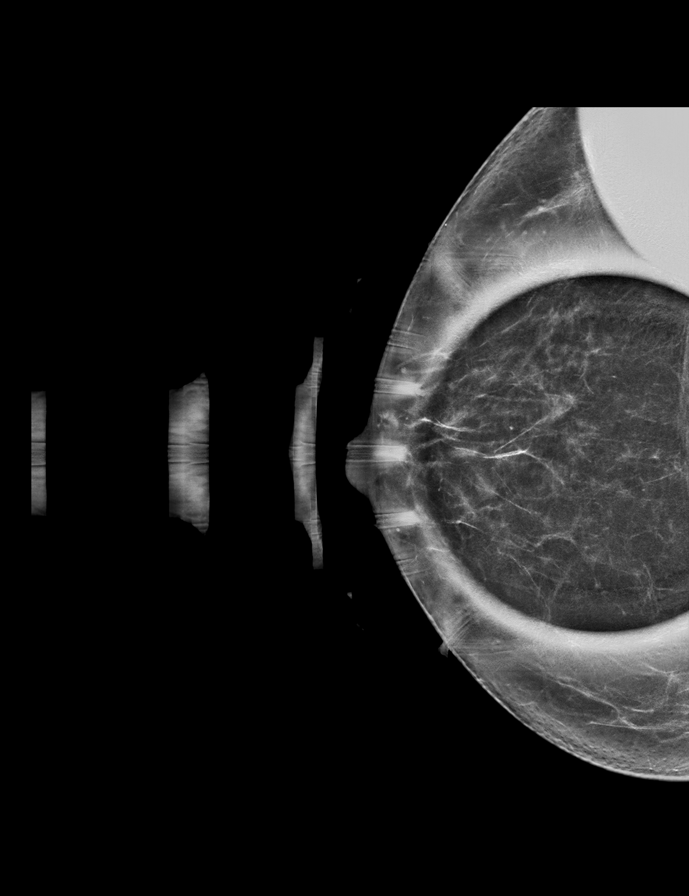

[R MLO synth-2D]
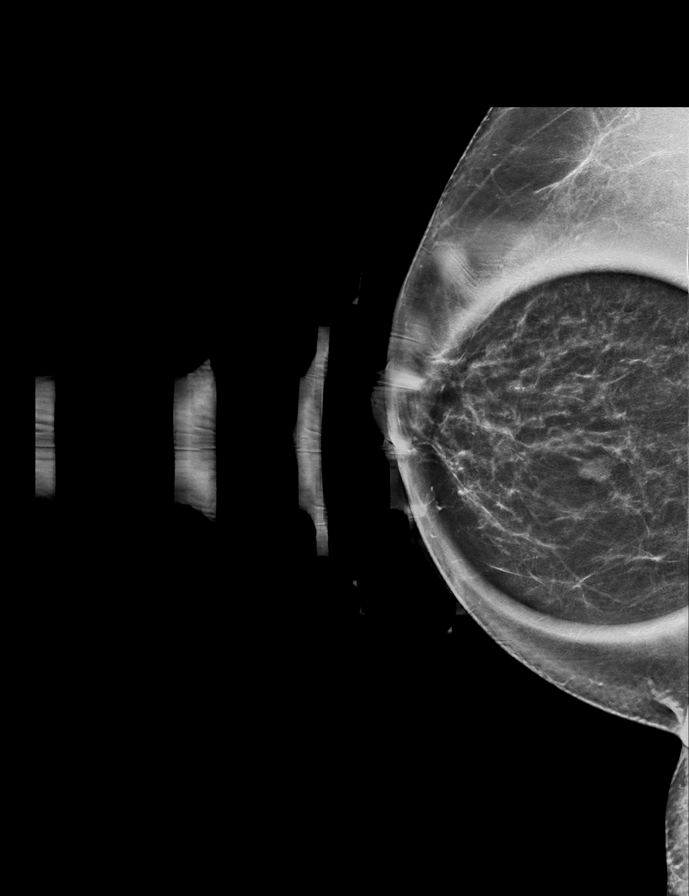

[L MLO synth-2D]
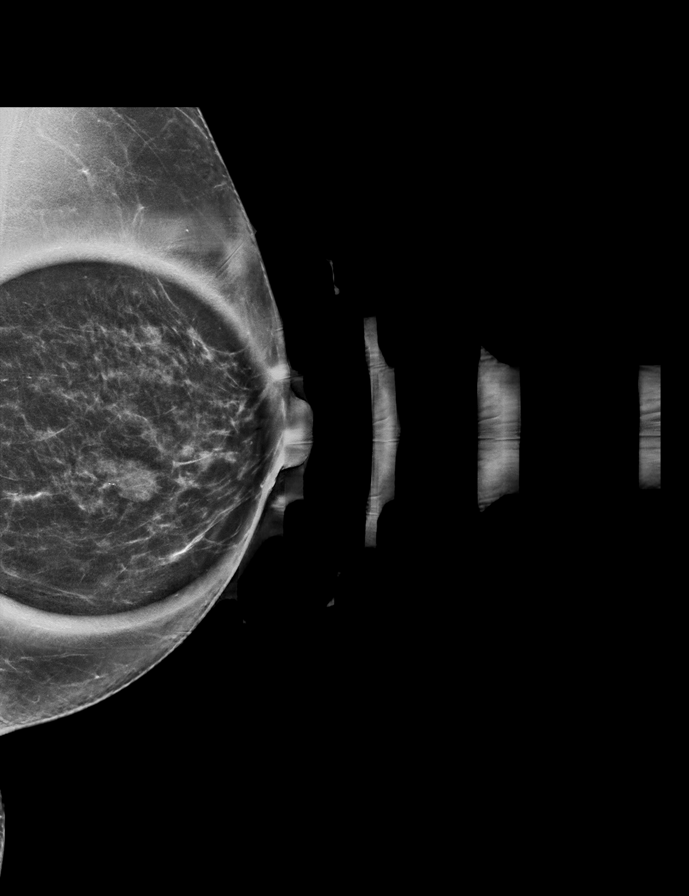

[R MLO tomo · tomo slice 32/63.0]
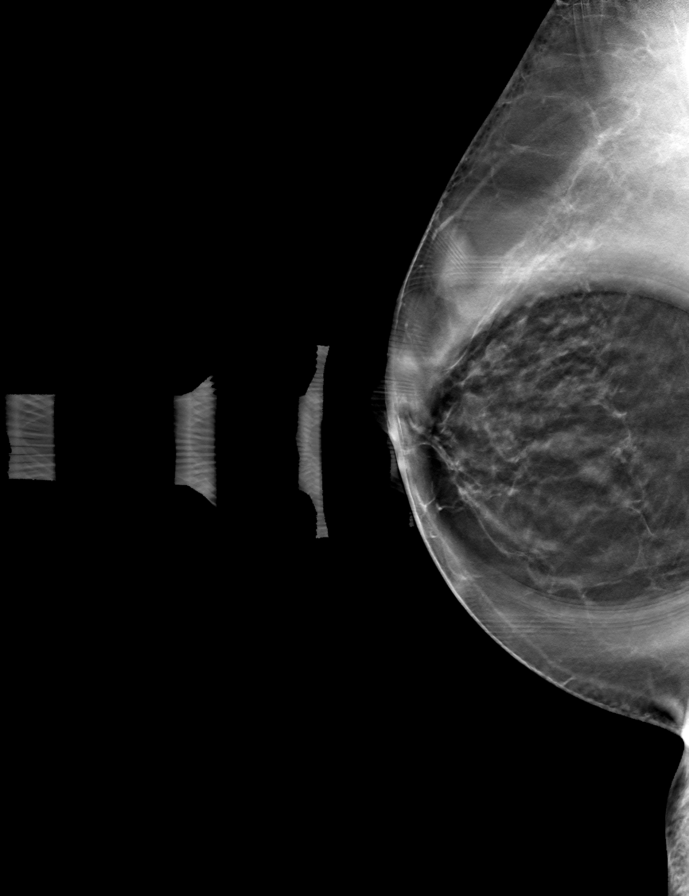

[R CC tomo · tomo slice 35/69.0]
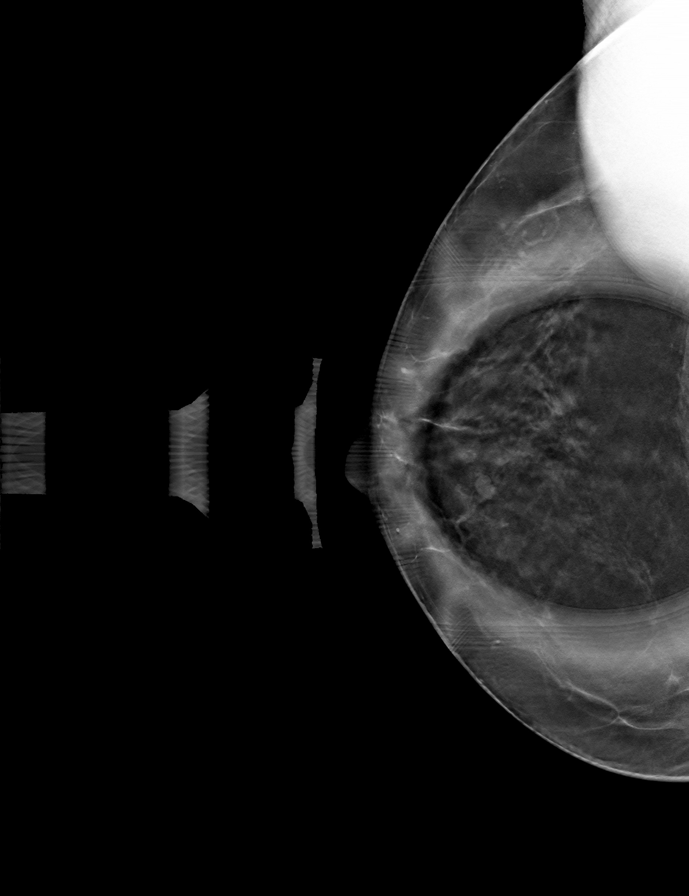

[L MLO tomo · tomo slice 33/64.0]
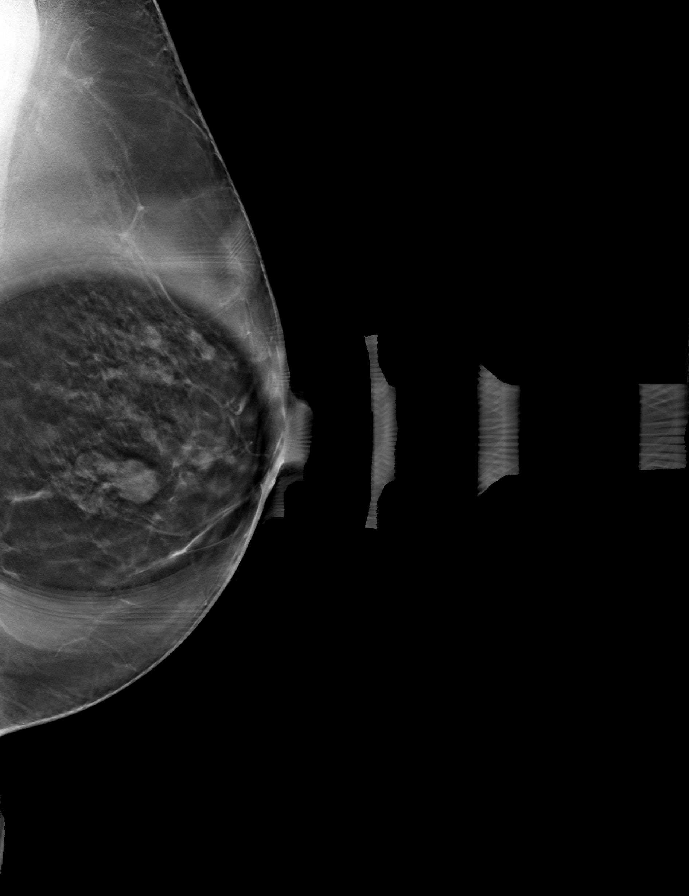

[L CC tomo · tomo slice 35/69.0]
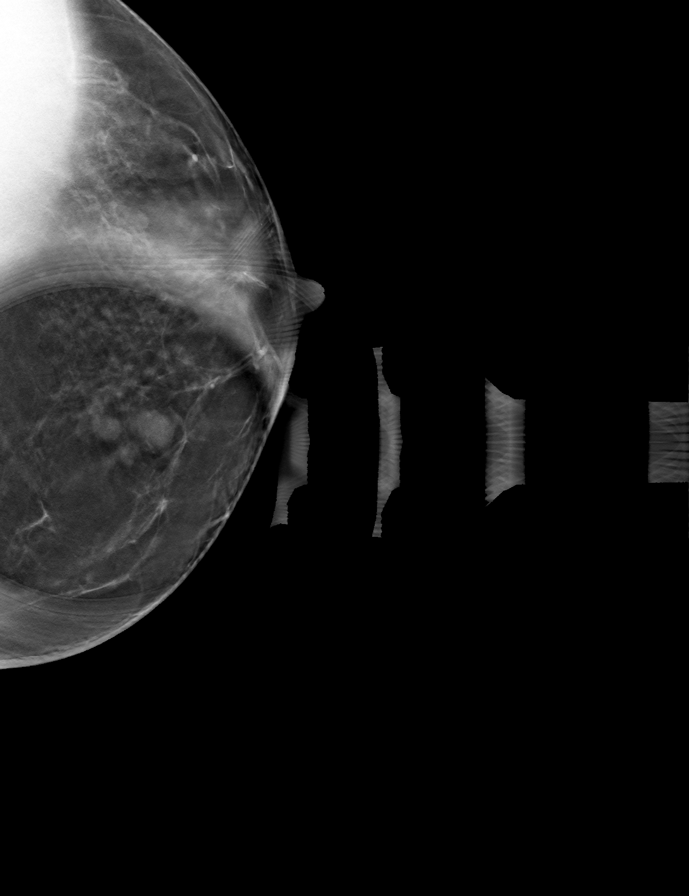

[8 of 24 positions shown; findings below may reference images not displayed]

ACR Breast Density Category b: There are scattered areas of
fibroglandular density.
FINDINGS: There is a persistent circumscribed oval mass in the lower slightly
outer right breast, middle depth. There is a cluster of masses with
circumscribed margins in the medial left breast.

Mammographic images were processed with CAD.

Ultrasound of the right breast at 7 o'clock, 3 cm from the nipple
demonstrates a benign cluster of cysts measuring 0.9 x 0.4 x 0.7 cm.
New line in the left breast at 9 o'clock, 2 cm from the nipple there
are several anechoic oval circumscribed masses consistent with
cysts. The largest measures 0.9 x 0.8 x 1.0 cm.
IMPRESSION: There are benign cysts in the bilateral breasts, accounting for the
masses identified on the screening mammogram.

RECOMMENDATION:
Screening mammogram in one year.(Code:5X-H-EG2)

I have discussed the findings and recommendations with the patient.
Results were also provided in writing at the conclusion of the
visit. If applicable, a reminder letter will be sent to the patient
regarding the next appointment.

BI-RADS CATEGORY  2: Benign.

## 2020-12-20 ENCOUNTER — Ambulatory Visit (INDEPENDENT_AMBULATORY_CARE_PROVIDER_SITE_OTHER): Payer: 59 | Admitting: Dermatology

## 2020-12-20 ENCOUNTER — Other Ambulatory Visit: Payer: Self-pay

## 2020-12-20 ENCOUNTER — Encounter: Payer: Self-pay | Admitting: Dermatology

## 2020-12-20 DIAGNOSIS — L570 Actinic keratosis: Secondary | ICD-10-CM

## 2020-12-20 DIAGNOSIS — Z1283 Encounter for screening for malignant neoplasm of skin: Secondary | ICD-10-CM

## 2020-12-21 ENCOUNTER — Encounter: Payer: Self-pay | Admitting: Dermatology

## 2020-12-21 NOTE — Progress Notes (Signed)
° °  Follow-Up Visit   Subjective  Shelley Collins is a 59 y.o. female who presents for the following: Skin Problem (Place in scalp x 6 months- wont go away).  General skin check Location:  Duration:  Quality: New crust on front scalp Associated Signs/Symptoms: Modifying Factors:  Severity:  Timing: Context:   Objective  Well appearing patient in no apparent distress; mood and affect are within normal limits. Objective  Left Upper Back: Waist up skin examination- no atypical moles or non mole skin cancers  Objective  Mid Parietal Scalp: Hornlike 2 mm pink crust   All sun exposed areas plus back examined.   Assessment & Plan    Encounter for screening for malignant neoplasm of skin Left Upper Back  Yearly skin check  AK (actinic keratosis) Mid Parietal Scalp  If clears with freezing she can cancel her follow-up examination.  If freezing 1-2 times does not clear this, will obtain biopsy in future.  Destruction of lesion - Mid Parietal Scalp Complexity: simple   Destruction method: cryotherapy   Informed consent: discussed and consent obtained   Timeout:  patient name, date of birth, surgical site, and procedure verified Lesion destroyed using liquid nitrogen: Yes   Cryotherapy cycles:  3 Outcome: patient tolerated procedure well with no complications       I, Lavonna Monarch, MD, have reviewed all documentation for this visit.  The documentation on 12/21/20 for the exam, diagnosis, procedures, and orders are all accurate and complete.

## 2021-02-16 ENCOUNTER — Ambulatory Visit: Payer: 59 | Admitting: Dermatology

## 2021-05-01 ENCOUNTER — Other Ambulatory Visit: Payer: Self-pay

## 2021-05-01 ENCOUNTER — Ambulatory Visit: Payer: 59 | Admitting: Dermatology

## 2021-05-01 DIAGNOSIS — C44612 Basal cell carcinoma of skin of right upper limb, including shoulder: Secondary | ICD-10-CM | POA: Diagnosis not present

## 2021-05-01 DIAGNOSIS — D485 Neoplasm of uncertain behavior of skin: Secondary | ICD-10-CM

## 2021-05-01 DIAGNOSIS — C4491 Basal cell carcinoma of skin, unspecified: Secondary | ICD-10-CM

## 2021-05-01 DIAGNOSIS — L570 Actinic keratosis: Secondary | ICD-10-CM | POA: Diagnosis not present

## 2021-05-01 HISTORY — DX: Basal cell carcinoma of skin, unspecified: C44.91

## 2021-05-01 NOTE — Patient Instructions (Signed)

## 2021-05-10 ENCOUNTER — Encounter: Payer: Self-pay | Admitting: Dermatology

## 2021-05-10 NOTE — Progress Notes (Signed)
   Follow-Up Visit   Subjective  Shelley Collins is a 60 y.o. female who presents for the following: Follow-up (Follow up Ak on scalp. LN2 at last visit. Spot is better.).  Recheck spot on scalp, possible new spot on arm Location:  Duration:  Quality:  Associated Signs/Symptoms: Modifying Factors:  Severity:  Timing: Context:   Objective  Well appearing patient in no apparent distress; mood and affect are within normal limits. Objective  Mid Frontal Scalp: Smooth pink area outside for treatment  Objective  Right Forearm - Anterior: Waxy 24mm crust compatible with BCC       A focused examination was performed including Head, neck, arms.. Relevant physical exam findings are noted in the Assessment and Plan.   Assessment & Plan    AK (actinic keratosis) Mid Frontal Scalp  Follow up as needed. Spot is cleared.  Neoplasm of uncertain behavior of skin Right Forearm - Anterior  Skin / nail biopsy Type of biopsy: tangential   Informed consent: discussed and consent obtained   Timeout: patient name, date of birth, surgical site, and procedure verified   Anesthesia: the lesion was anesthetized in a standard fashion   Anesthetic:  1% lidocaine w/ epinephrine 1-100,000 local infiltration Instrument used: flexible razor blade   Hemostasis achieved with: ferric subsulfate   Outcome: patient tolerated procedure well   Post-procedure details: wound care instructions given    Destruction of lesion Complexity: simple   Destruction method: electrodesiccation and curettage   Informed consent: discussed and consent obtained   Timeout:  patient name, date of birth, surgical site, and procedure verified Anesthesia: the lesion was anesthetized in a standard fashion   Anesthetic:  1% lidocaine w/ epinephrine 1-100,000 local infiltration Curettage performed in three different directions: Yes   Curettage cycles:  3 Lesion length (cm):  0.7 Lesion width (cm):  0.7 Margin per side  (cm):  0 Final wound size (cm):  0.7 Hemostasis achieved with:  aluminum chloride Outcome: patient tolerated procedure well with no complications   Post-procedure details: wound care instructions given    Specimen 1 - Surgical pathology Differential Diagnosis: scc vs bcc  Check Margins: No  After shave biopsy the base of the lesion was treated with triple curettage plus cautery      I, Lavonna Monarch, MD, have reviewed all documentation for this visit.  The documentation on 05/10/21 for the exam, diagnosis, procedures, and orders are all accurate and complete.

## 2021-07-04 DIAGNOSIS — J0391 Acute recurrent tonsillitis, unspecified: Secondary | ICD-10-CM | POA: Diagnosis not present

## 2021-09-06 DIAGNOSIS — F900 Attention-deficit hyperactivity disorder, predominantly inattentive type: Secondary | ICD-10-CM | POA: Diagnosis not present

## 2021-12-12 DIAGNOSIS — F900 Attention-deficit hyperactivity disorder, predominantly inattentive type: Secondary | ICD-10-CM | POA: Diagnosis not present

## 2022-02-28 ENCOUNTER — Ambulatory Visit (INDEPENDENT_AMBULATORY_CARE_PROVIDER_SITE_OTHER): Payer: BC Managed Care – PPO | Admitting: Physician Assistant

## 2022-02-28 ENCOUNTER — Encounter: Payer: Self-pay | Admitting: Physician Assistant

## 2022-02-28 ENCOUNTER — Other Ambulatory Visit: Payer: Self-pay

## 2022-02-28 DIAGNOSIS — L821 Other seborrheic keratosis: Secondary | ICD-10-CM | POA: Diagnosis not present

## 2022-02-28 DIAGNOSIS — L570 Actinic keratosis: Secondary | ICD-10-CM | POA: Diagnosis not present

## 2022-02-28 DIAGNOSIS — D485 Neoplasm of uncertain behavior of skin: Secondary | ICD-10-CM

## 2022-02-28 MED ORDER — KLISYRI 1 % EX OINT: TOPICAL_OINTMENT | CUTANEOUS | 0 refills | Status: AC

## 2022-02-28 NOTE — Patient Instructions (Addendum)
Seborrheic Keratosis ?A seborrheic keratosis is a common, noncancerous (benign) skin growth. These growths are velvety, waxy, rough, tan, brown, or black spots that appear on the skin. These skin growths can be flat or raised, and scaly. ?What are the causes? ?The cause of this condition is not known. ?What increases the risk? ?You are more likely to develop this condition if you: ?Have a family history of seborrheic keratosis. ?Are 50 or older. ?Are pregnant. ?Have had estrogen replacement therapy. ?What are the signs or symptoms? ?Symptoms of this condition include growths on the face, chest, shoulders, back, or other areas. These growths: ?Are usually painless, but may become irritated and itchy. ?Can be yellow, brown, black, or other colors. ?Are slightly raised or have a flat surface. ?Are sometimes rough or wart-like in texture. ?Are often velvety or waxy on the surface. ?Are round or oval-shaped. ?Often occur in groups, but may occur as a single growth. ?How is this diagnosed? ?This condition is diagnosed with a medical history and physical exam. ?A sample of the growth may be tested (skin biopsy). ?You may need to see a skin specialist (dermatologist). ?How is this treated? ?Treatment is not usually needed for this condition, unless the growths are irritated or bleed often. ?You may also choose to have the growths removed if you do not like their appearance. ?Most commonly, these growths are treated with a procedure in which liquid nitrogen is applied to "freeze" off the growth (cryosurgery). ?They may also be burned off with electricity (electrocautery) or removed by scraping (curettage). ?Follow these instructions at home: ?Watch your growth for any changes. ?Keep all follow-up visits as told by your health care provider. This is important. ?Do not scratch or pick at the growth or growths. This can cause them to become irritated or infected. ?Contact a health care provider if: ?You suddenly have many new  growths. ?Your growth bleeds, itches, or hurts. ?Your growth suddenly becomes larger or changes color. ?Summary ?A seborrheic keratosis is a common, noncancerous (benign) skin growth. ?Treatment is not usually needed for this condition, unless the growths are irritated or bleed often. ?Watch your growth for any changes. ?Contact a health care provider if you suddenly have many new growths or your growth suddenly becomes larger or changes color. ?Keep all follow-up visits as told by your health care provider. This is important. ?This information is not intended to replace advice given to you by your health care provider. Make sure you discuss any questions you have with your health care provider. ?Document Revised: 05/01/2018 Document Reviewed: 05/01/2018 ?Elsevier Patient Education ? 2022 [place]. ? ? ? ? ? ?Biopsy, Surgery (Curettage) & Surgery (Excision) Aftercare Instructions ? ?1. Okay to remove bandage in 24 hours ? ?2. Wash area with soap and water ? ?3. Apply Vaseline to area twice daily until healed (Not Neosporin) ? ?4. Okay to cover with a Band-Aid to decrease the chance of infection or prevent irritation from clothing; also it's okay to uncover lesion at home. ? ?5. Suture instructions: return to our office in 7-10 or 10-14 days for a nurse visit for suture removal. Variable healing with sutures, if pain or itching occurs call our office. It's okay to shower or bathe 24 hours after sutures are given. ? ?6. The following risks may occur after a biopsy, curettage or excision: bleeding, scarring, discoloration, recurrence, infection (redness, yellow drainage, pain or swelling). ? ?7. For questions, concerns and results call our office at [place] before 4pm & Friday before  3pm. Biopsy results will be available in 1 week. ? ? ?

## 2022-03-01 ENCOUNTER — Encounter: Payer: Self-pay | Admitting: Physician Assistant

## 2022-03-01 NOTE — Progress Notes (Signed)
? ?  Follow-Up Visit ?  ?Subjective  ?Shelley Collins is a 61 y.o. female who presents for the following: Skin Problem (Patient here today for lesion on her left forehead x 10 months no bleeding, no pain. Check lesions on both hands x 1 year per patient the lesions are flaky, no bleeding. ). ? ? ?The following portions of the chart were reviewed this encounter and updated as appropriate:  Tobacco  Allergies  Meds  Problems  Med Hx  Surg Hx  Fam Hx   ?  ? ?Objective  ?Well appearing patient in no apparent distress; mood and affect are within normal limits. ? ?A full examination was performed including scalp, head, eyes, ears, nose, lips, neck, chest, axillae, abdomen, back, buttocks, bilateral upper extremities, bilateral lower extremities, hands, feet, fingers, toes, fingernails, and toenails. All findings within normal limits unless otherwise noted below. ? ?Left Forehead, Left Parotid Area, Left Temple ?Stuck-on plaques.  ? ?Head - Anterior (Face) ?Erythematous patches with gritty scale. ? ?Left Forearm - Posterior ?Hyperkeratotic scale with pink base  ? ? ? ? ? ? ? ?Assessment & Plan  ?Seborrheic keratosis (3) ?Left Temple; Left Parotid Area; Left Forehead ? ?observe ? ?Actinic keratosis ?Head - Anterior (Face) ? ?Tirbanibulin (KLISYRI) 1 % OINT - Head - Anterior (Face) ?APPLY TO AFFECTED AREA x 5 NIGHTS ? ?Neoplasm of uncertain behavior of skin ?Left Forearm - Posterior ? ?Skin / nail biopsy ?Type of biopsy: tangential   ?Informed consent: discussed and consent obtained   ?Timeout: patient name, date of birth, surgical site, and procedure verified   ?Procedure prep:  Patient was prepped and draped in usual sterile fashion (Non sterile) ?Prep type:  Chlorhexidine ?Anesthesia: the lesion was anesthetized in a standard fashion   ?Anesthetic:  1% lidocaine w/ epinephrine 1-100,000 local infiltration ?Instrument used: flexible razor blade   ?Hemostasis achieved with: aluminum chloride   ?Outcome: patient tolerated  procedure well   ?Post-procedure details: sterile dressing applied and wound care instructions given   ?Dressing type: bandage and petrolatum   ? ?Destruction of lesion ?Complexity: simple   ?Destruction method: electrodesiccation and curettage   ?Informed consent: discussed and consent obtained   ?Timeout:  patient name, date of birth, surgical site, and procedure verified ?Anesthesia: the lesion was anesthetized in a standard fashion   ?Anesthetic:  1% lidocaine w/ epinephrine 1-100,000 local infiltration ?Curettage performed in three different directions: Yes   ?Curettage cycles:  3 ?Margin per side (cm):  0.1 ?Final wound size (cm):  1 ?Hemostasis achieved with:  aluminum chloride and electrodesiccation ?Outcome: patient tolerated procedure well with no complications   ?Post-procedure details: wound care instructions given   ? ?Specimen 1 - Surgical pathology ?Differential Diagnosis: R/O BCC vs SCC - treated after biopsy ? ?Check Margins: No ? ? ? ?I, Catlyn Shipton, PA-C, have reviewed all documentation's for this visit.  The documentation on 03/01/22 for the exam, diagnosis, procedures and orders are all accurate and complete. ?

## 2022-03-06 ENCOUNTER — Telehealth: Payer: Self-pay | Admitting: *Deleted

## 2022-03-06 NOTE — Telephone Encounter (Signed)
-----   Message from Warren Danes, [place] sent at 03/06/2022  8:06 AM EST ----- ?Rtc if recurs ?

## 2022-03-06 NOTE — Telephone Encounter (Signed)
Pathology to patient.  °

## 2022-03-15 DIAGNOSIS — Z0000 Encounter for general adult medical examination without abnormal findings: Secondary | ICD-10-CM | POA: Diagnosis not present

## 2022-03-15 DIAGNOSIS — F39 Unspecified mood [affective] disorder: Secondary | ICD-10-CM | POA: Diagnosis not present

## 2022-03-15 DIAGNOSIS — F900 Attention-deficit hyperactivity disorder, predominantly inattentive type: Secondary | ICD-10-CM | POA: Diagnosis not present

## 2022-03-15 DIAGNOSIS — E785 Hyperlipidemia, unspecified: Secondary | ICD-10-CM | POA: Diagnosis not present

## 2022-05-07 DIAGNOSIS — Z0000 Encounter for general adult medical examination without abnormal findings: Secondary | ICD-10-CM | POA: Diagnosis not present

## 2022-06-15 DIAGNOSIS — D649 Anemia, unspecified: Secondary | ICD-10-CM | POA: Diagnosis not present

## 2022-06-15 DIAGNOSIS — F39 Unspecified mood [affective] disorder: Secondary | ICD-10-CM | POA: Diagnosis not present

## 2022-06-15 DIAGNOSIS — E785 Hyperlipidemia, unspecified: Secondary | ICD-10-CM | POA: Diagnosis not present

## 2022-06-15 DIAGNOSIS — F900 Attention-deficit hyperactivity disorder, predominantly inattentive type: Secondary | ICD-10-CM | POA: Diagnosis not present

## 2022-06-15 DIAGNOSIS — Z5181 Encounter for therapeutic drug level monitoring: Secondary | ICD-10-CM | POA: Diagnosis not present

## 2022-07-13 DIAGNOSIS — Z01818 Encounter for other preprocedural examination: Secondary | ICD-10-CM | POA: Diagnosis not present

## 2022-07-13 DIAGNOSIS — F900 Attention-deficit hyperactivity disorder, predominantly inattentive type: Secondary | ICD-10-CM | POA: Diagnosis not present

## 2022-08-16 DIAGNOSIS — Z13820 Encounter for screening for osteoporosis: Secondary | ICD-10-CM | POA: Diagnosis not present

## 2022-08-16 DIAGNOSIS — Z6824 Body mass index (BMI) 24.0-24.9, adult: Secondary | ICD-10-CM | POA: Diagnosis not present

## 2022-08-16 DIAGNOSIS — Z01419 Encounter for gynecological examination (general) (routine) without abnormal findings: Secondary | ICD-10-CM | POA: Diagnosis not present

## 2022-08-16 DIAGNOSIS — Z1231 Encounter for screening mammogram for malignant neoplasm of breast: Secondary | ICD-10-CM | POA: Diagnosis not present

## 2022-08-20 ENCOUNTER — Other Ambulatory Visit: Payer: Self-pay | Admitting: Obstetrics and Gynecology

## 2022-08-20 DIAGNOSIS — R928 Other abnormal and inconclusive findings on diagnostic imaging of breast: Secondary | ICD-10-CM

## 2022-09-04 ENCOUNTER — Other Ambulatory Visit: Payer: BC Managed Care – PPO

## 2022-09-12 ENCOUNTER — Other Ambulatory Visit: Payer: BC Managed Care – PPO

## 2022-09-12 ENCOUNTER — Ambulatory Visit
Admission: RE | Admit: 2022-09-12 | Discharge: 2022-09-12 | Disposition: A | Discharge status: Home / Self Care | Payer: BC Managed Care – PPO | Source: Ambulatory Visit | Attending: Obstetrics and Gynecology | Admitting: Obstetrics and Gynecology

## 2022-09-12 DIAGNOSIS — N6002 Solitary cyst of left breast: Secondary | ICD-10-CM | POA: Diagnosis not present

## 2022-09-12 DIAGNOSIS — R928 Other abnormal and inconclusive findings on diagnostic imaging of breast: Secondary | ICD-10-CM

## 2022-10-17 ENCOUNTER — Ambulatory Visit: Payer: BC Managed Care – PPO | Admitting: Physician Assistant
